# Patient Record
Sex: Male | Born: 1990 | Race: Black or African American | Hispanic: No | Marital: Single | Smoking: Current every day smoker
Health system: Southern US, Community
[De-identification: ages and names within clinical notes are randomized; demographics above are authoritative.]

## PROBLEM LIST (undated history)

## (undated) DIAGNOSIS — Z5900 Homelessness unspecified: Secondary | ICD-10-CM

## (undated) HISTORY — PX: SKIN GRAFT SPLIT THICKNESS LEG / FOOT: SUR1303

---
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
## 2011-08-20 ENCOUNTER — Encounter (HOSPITAL_COMMUNITY): Payer: Self-pay | Admitting: *Deleted

## 2011-08-20 ENCOUNTER — Emergency Department (HOSPITAL_COMMUNITY)
Admission: EM | Admit: 2011-08-20 | Discharge: 2011-08-20 | Disposition: A | Discharge status: Home / Self Care | Payer: 59 | Attending: Emergency Medicine | Admitting: Emergency Medicine

## 2011-08-20 DIAGNOSIS — R109 Unspecified abdominal pain: Secondary | ICD-10-CM | POA: Insufficient documentation

## 2011-08-20 DIAGNOSIS — R112 Nausea with vomiting, unspecified: Secondary | ICD-10-CM | POA: Insufficient documentation

## 2011-08-20 MED ORDER — ONDANSETRON 4 MG PO TBDP
4.0000 mg | ORAL_TABLET | Freq: Once | ORAL | Status: AC
  Administered 2011-08-20: 4 mg via ORAL
  Filled 2011-08-20: qty 1

## 2011-08-20 MED ORDER — PROMETHAZINE HCL 25 MG PO TABS: 25.0000 mg | ORAL_TABLET | Freq: Four times a day (QID) | ORAL | Status: AC | PRN

## 2011-08-20 NOTE — ED Provider Notes (Signed)
History     CSN: [id number]  Arrival date & time 08/20/11  1842   First MD Initiated Contact with Patient 08/20/11 2037      Chief Complaint  Patient presents with  . Nausea    (Consider location/radiation/quality/duration/timing/severity/associated sxs/prior treatment) Patient is a 21 y.o. male presenting with vomiting. The history is provided by the patient. No language interpreter was used.  Emesis  This is a new problem. The current episode started yesterday. The problem occurs 2 to 4 times per day. The problem has been gradually worsening. The emesis has an appearance of stomach contents. There has been no fever. Associated symptoms include abdominal pain. Pertinent negatives include no chills, no cough, no diarrhea, no fever and no headaches.    History reviewed. No pertinent past medical history.  History reviewed. No pertinent past surgical history.  No family history on file.  History  Substance Use Topics  . Smoking status: Not on file  . Smokeless tobacco: Not on file  . Alcohol Use: Not on file      Review of Systems  Constitutional: Negative for fever, chills, activity change, appetite change and fatigue.  HENT: Negative for congestion, sore throat, rhinorrhea, neck pain and neck stiffness.   Respiratory: Negative for cough and shortness of breath.   Cardiovascular: Negative for chest pain and palpitations.  Gastrointestinal: Positive for nausea, vomiting and abdominal pain. Negative for diarrhea.  Genitourinary: Negative for dysuria, urgency, frequency and flank pain.  Neurological: Negative for dizziness, weakness, light-headedness, numbness and headaches.  All other systems reviewed and are negative.    Allergies  Review of patient's allergies indicates no known allergies.  Home Medications   Current Outpatient Rx  Name Route Sig Dispense Refill  . PHENIRAMINE-PE-APAP 20-10-650 MG PO PACK Oral Take 30 mLs by mouth every 6 (six) hours as needed. For  sore throat symptoms    . PSEUDOEPH-DOXYLAMINE-DM-APAP 30-6.25-15-325 MG PO CAPS Oral Take 2 capsules by mouth every 6 (six) hours as needed. For fever reduction    . PROMETHAZINE HCL 25 MG PO TABS Oral Take 1 tablet (25 mg total) by mouth every 6 (six) hours as needed for nausea. 12 tablet 0    BP 120/80  Pulse 84  Temp(Src) 99.4 F (37.4 C) (Oral)  SpO2 99%  Physical Exam  Nursing note and vitals reviewed. Constitutional: He is oriented to person, place, and time. He appears well-developed and well-nourished. No distress.  HENT:  Head: Normocephalic and atraumatic.  Mouth/Throat: Oropharynx is clear and moist.  Eyes: Conjunctivae and EOM are normal. Pupils are equal, round, and reactive to light.  Neck: Normal range of motion. Neck supple.  Cardiovascular: Normal rate, regular rhythm, normal heart sounds and intact distal pulses.  Exam reveals no gallop and no friction rub.   No murmur heard. Abdominal: Soft. Bowel sounds are normal. There is no tenderness.  Musculoskeletal: Normal range of motion. He exhibits no tenderness.  Neurological: He is alert and oriented to person, place, and time. No cranial nerve deficit.  Skin: Skin is warm and dry. No rash noted.    ED Course  Procedures (including critical care time)  Labs Reviewed - No data to display No results found.   1. Nausea and vomiting       MDM  Patient with nausea and vomiting secondary to eating an entire large pizza. There is no indication for imaging or laboratory studies at this time. He will be given a dose of Zofran and instructed to followup  with primary care physician. I will discharge her with a small prescription of Phenergan.        Dayton Bailiff, MD 08/20/11 2208
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
## 2011-08-20 NOTE — ED Notes (Signed)
MD at bedside. King.

## 2015-12-08 ENCOUNTER — Emergency Department (HOSPITAL_COMMUNITY)
Admission: EM | Admit: 2015-12-08 | Discharge: 2015-12-08 | Disposition: A | Discharge status: Home / Self Care | Payer: Self-pay | Attending: Emergency Medicine | Admitting: Emergency Medicine

## 2015-12-08 ENCOUNTER — Emergency Department (HOSPITAL_COMMUNITY): Payer: Self-pay

## 2015-12-08 ENCOUNTER — Encounter (HOSPITAL_COMMUNITY): Payer: Self-pay | Admitting: Emergency Medicine

## 2015-12-08 DIAGNOSIS — R0602 Shortness of breath: Secondary | ICD-10-CM | POA: Insufficient documentation

## 2015-12-08 DIAGNOSIS — R079 Chest pain, unspecified: Secondary | ICD-10-CM | POA: Insufficient documentation

## 2015-12-08 DIAGNOSIS — R000 Tachycardia, unspecified: Secondary | ICD-10-CM | POA: Insufficient documentation

## 2015-12-08 LAB — CBC WITH DIFFERENTIAL/PLATELET
Basophils Absolute: 0 10*3/uL (ref 0.0–0.1)
Basophils Relative: 0 %
Eosinophils Absolute: 0 10*3/uL (ref 0.0–0.7)
Eosinophils Relative: 0 %
HCT: 41.2 % (ref 39.0–52.0)
Hemoglobin: 13.5 g/dL (ref 13.0–17.0)
Lymphocytes Relative: 19 %
Lymphs Abs: 1.4 10*3/uL (ref 0.7–4.0)
MCH: 26.2 pg (ref 26.0–34.0)
MCHC: 32.8 g/dL (ref 30.0–36.0)
MCV: 80 fL (ref 78.0–100.0)
Monocytes Absolute: 0.3 10*3/uL (ref 0.1–1.0)
Monocytes Relative: 4 %
Neutro Abs: 5.8 10*3/uL (ref 1.7–7.7)
Neutrophils Relative %: 77 %
Platelets: 211 10*3/uL (ref 150–400)
RBC: 5.15 MIL/uL (ref 4.22–5.81)
RDW: 14.2 % (ref 11.5–15.5)
WBC: 7.6 10*3/uL (ref 4.0–10.5)

## 2015-12-08 LAB — BRAIN NATRIURETIC PEPTIDE: B Natriuretic Peptide: 7 pg/mL (ref 0.0–100.0)

## 2015-12-08 LAB — BASIC METABOLIC PANEL
Anion gap: 10 (ref 5–15)
BUN: 9 mg/dL (ref 6–20)
CO2: 22 mmol/L (ref 22–32)
Calcium: 9.2 mg/dL (ref 8.9–10.3)
Chloride: 103 mmol/L (ref 101–111)
Creatinine, Ser: 0.93 mg/dL (ref 0.61–1.24)
GFR calc Af Amer: >60 mL/min (ref >60)
GFR calc non Af Amer: >60 mL/min (ref >60)
Glucose, Bld: 80 mg/dL (ref 65–99)
Potassium: 4.3 mmol/L (ref 3.5–5.1)
Sodium: 135 mmol/L (ref 135–145)

## 2015-12-08 LAB — D-DIMER, QUANTITATIVE: D-Dimer, Quant: <0.27 ug/mL-FEU (ref 0.00–0.50)

## 2015-12-08 LAB — TROPONIN I: Troponin I: <0.03 ng/mL (ref <0.031)

## 2015-12-08 MED ORDER — IBUPROFEN 800 MG PO TABS
800.0000 mg | ORAL_TABLET | Freq: Once | ORAL | Status: AC
  Administered 2015-12-08: 800 mg via ORAL
  Filled 2015-12-08: qty 1

## 2015-12-08 NOTE — ED Notes (Signed)
Per [facility] patient from home complaining of chest pain and shortness of breath x4 days.  Patient alert and oriented at this time.

## 2015-12-08 NOTE — ED Notes (Signed)
Pt verbalized understanding of discharge instructions and follow up care

## 2015-12-08 NOTE — Discharge Instructions (Signed)
Take over-the-counter ibuprofen every 4-6 hours as needed for your pain. Use moist heat 3-4 times daily also pain 20 minutes on, 20 minutes off. Do gentle stretches as tolerated. Please follow-up and establish care with a primary care provider, such as the one listed in your discharge paperwork or by calling the number circled on your discharge paperwork. Please return to emergency department if you develop any new or worsening symptoms, such as fever, cough.   Chest Wall Pain Chest wall pain is pain in or around the bones and muscles of your chest. Sometimes, an injury causes this pain. Sometimes, the cause may not be known. This pain may take several weeks or longer to get better. HOME CARE INSTRUCTIONS  Pay attention to any changes in your symptoms. Take these actions to help with your pain:   Rest as told by your health care provider.   Avoid activities that cause pain. These include any activities that use your chest muscles or your abdominal and side muscles to lift heavy items.   If directed, apply ice to the painful area:  Put ice in a plastic bag.  Place a towel between your skin and the bag.  Leave the ice on for 20 minutes, 2-3 times per day.  Take over-the-counter and prescription medicines only as told by your health care provider.  Do not use tobacco products, including cigarettes, chewing tobacco, and e-cigarettes. If you need help quitting, ask your health care provider.  Keep all follow-up visits as told by your health care provider. This is important. SEEK MEDICAL CARE IF:  You have a fever.  Your chest pain becomes worse.  You have new symptoms. SEEK IMMEDIATE MEDICAL CARE IF:  You have nausea or vomiting.  You feel sweaty or light-headed.  You have a cough with phlegm (sputum) or you cough up blood.  You develop shortness of breath.   This information is not intended to replace advice given to you by your health care provider. Make sure you discuss any  questions you have with your health care provider.   Document Released: 07/02/2005 Document Revised: 03/23/2015 Document Reviewed: 09/27/2014 Elsevier Interactive Patient Education [employer]2016 [employer] Inc.

## 2015-12-08 NOTE — ED Provider Notes (Signed)
CSN: [id number]     Arrival date & time 12/08/15  1710 History   First MD Initiated Contact with Patient 12/08/15 1804     Chief Complaint  Patient presents with  . Chest Pain     (Consider location/radiation/quality/duration/timing/severity/associated sxs/prior Treatment) HPI Comments: Patient is a previously healthy 25 year old male who presents with left-sided chest pain and shortness of breath with exertion for 1 week. Patient describes his pain as squeezing and intermittent every 10 minutes. The pain does not radiate. The pain is pleuritic as well as worse with movement. Patient rates his pain as a 6/10 at its worst. Patient has shortness of breath with exertion, not at rest. Patient has not tried any medications at home. Patient denies fall or injury. Patient denies cocaine use. Patient reports occasional marijuana use, patient does not smoke cigarettes. Patient denies fever, cough, abdominal pain, nausea, vomiting, dysuria.  Patient is a 25 y.o. male presenting with chest pain. The history is provided by the patient.  Chest Pain Associated symptoms: shortness of breath   Associated symptoms: no abdominal pain, no back pain, no fever, no headache, no nausea, no numbness and not vomiting     History reviewed. No pertinent past medical history. History reviewed. No pertinent past surgical history. No family history on file. Social History  Substance Use Topics  . Smoking status: Never Smoker   . Smokeless tobacco: None  . Alcohol Use: No    Review of Systems  Constitutional: Negative for fever and chills.  HENT: Negative for facial swelling and sore throat.   Respiratory: Positive for shortness of breath.   Cardiovascular: Positive for chest pain.  Gastrointestinal: Negative for nausea, vomiting and abdominal pain.  Genitourinary: Negative for dysuria.  Musculoskeletal: Negative for back pain.  Skin: Negative for rash and wound.  Neurological: Negative for numbness and  headaches.  Psychiatric/Behavioral: The patient is not nervous/anxious.       Allergies  Review of patient's allergies indicates no known allergies.  Home Medications   Prior to Admission medications   Not on File   BP 115/79 mmHg  Pulse 101  Temp(Src) 98.3 F (36.8 C) (Oral)  Resp 18  SpO2 100% Physical Exam  Constitutional: He appears well-developed and well-nourished. No distress.  HENT:  Head: Normocephalic and atraumatic.  Mouth/Throat: Oropharynx is clear and moist. No oropharyngeal exudate.  Eyes: Conjunctivae are normal. Pupils are equal, round, and reactive to light. Right eye exhibits no discharge. Left eye exhibits no discharge. No scleral icterus.  Neck: Normal range of motion. Neck supple. No thyromegaly present.  Cardiovascular: Regular rhythm, normal heart sounds and intact distal pulses.  Tachycardia present.  Exam reveals no gallop and no friction rub.   No murmur heard. Pulmonary/Chest: Effort normal and breath sounds normal. No stridor. No respiratory distress. He has no wheezes. He has no rales. He exhibits tenderness.    Abdominal: Soft. Bowel sounds are normal. He exhibits no distension. There is no tenderness. There is no rebound and no guarding.  Musculoskeletal: He exhibits no edema.  Equal bilateral grip strength, 5/5 strength in all 4 extremities; no tenderness to palpation to bilateral calves; no edema noted  Lymphadenopathy:    He has no cervical adenopathy.  Neurological: He is alert. Coordination normal.  Skin: Skin is warm and dry. No rash noted. He is not diaphoretic. No pallor.  Psychiatric: He has a normal mood and affect.  Nursing note and vitals reviewed.   ED Course  Procedures (including critical care time)  Labs Review Labs Reviewed  BASIC METABOLIC PANEL  CBC WITH DIFFERENTIAL/PLATELET  TROPONIN I  BRAIN NATRIURETIC PEPTIDE  D-DIMER, QUANTITATIVE (NOT [facility])    Imaging Review Dg Chest 2 View  12/08/2015  CLINICAL DATA:   Shortness of breath on exertion for 1 week. EXAM: CHEST  2 VIEW COMPARISON:  None. FINDINGS: Cardiomediastinal silhouette is normal. Mediastinal contours appear intact. There is no evidence of pleural effusion or pneumothorax. There are streaky peribronchial opacities in the right middle lobe. Osseous structures are without acute abnormality. Soft tissues are grossly normal. IMPRESSION: Streaky peribronchial opacities in the right middle lobe, which in the right clinical setting may represent an early bronchopneumonia. Electronically Signed   By: Ted Mcalpineobrinka  Dimitrova M.D.   On: 12/08/2015 19:26   I have personally reviewed and evaluated these images and lab results as part of my medical decision-making.   EKG Interpretation   Date/Time:  Thursday Dec 08 2015 17:22:23 EDT Ventricular Rate:  91 PR Interval:  153 QRS Duration: 79 QT Interval:  353 QTC Calculation: 434 R Axis:   88 Text Interpretation:  Sinus rhythm Right atrial enlargement No old tracing  to compare Confirmed by JACUBOWITZ  MD, SAM [phone]([id number]) on 12/08/2015 5:29:41  PM      MDM   EKG shows NSR, right atrial enlargement. Troponin <0.03. BNP 7.0. D-dimer <0.27. CBC, BMP unremarkable. CXR shows streaky peribronchial opacities in the right middle lobe, which is right clinical setting may represent early bronchopneumonia. However, patient has no fever, cough, symptoms to suggest pneumonia. Patient's pain completely resolved with ibuprofen and ED. Patient discharged with symptomatic treatment including ibuprofen and moist heat. Patient advised to follow up and establish care with [facility]. Patient vitals stable throughout ED course and discharged in satisfactory condition. Case discussed with Dr. Effie ShyWentz who is in agreement with plan.   Final diagnoses:  Nonspecific chest pain  Exertional shortness of breath        Emi Holeslexandra M Chaitanya Amedee, PA-C 12/08/15 2006  Mancel BaleElliott Wentz, MD 12/08/15 [id number]

## 2016-01-09 ENCOUNTER — Emergency Department (HOSPITAL_COMMUNITY)
Admission: EM | Admit: 2016-01-09 | Discharge: 2016-01-09 | Disposition: A | Discharge status: Home / Self Care | Payer: Self-pay | Attending: Emergency Medicine | Admitting: Emergency Medicine

## 2016-01-09 ENCOUNTER — Encounter (HOSPITAL_COMMUNITY): Payer: Self-pay | Admitting: *Deleted

## 2016-01-09 ENCOUNTER — Emergency Department (HOSPITAL_COMMUNITY): Payer: Self-pay

## 2016-01-09 DIAGNOSIS — F17200 Nicotine dependence, unspecified, uncomplicated: Secondary | ICD-10-CM | POA: Insufficient documentation

## 2016-01-09 DIAGNOSIS — Y939 Activity, unspecified: Secondary | ICD-10-CM | POA: Insufficient documentation

## 2016-01-09 DIAGNOSIS — R55 Syncope and collapse: Secondary | ICD-10-CM | POA: Insufficient documentation

## 2016-01-09 DIAGNOSIS — S00511A Abrasion of lip, initial encounter: Secondary | ICD-10-CM | POA: Insufficient documentation

## 2016-01-09 DIAGNOSIS — Y929 Unspecified place or not applicable: Secondary | ICD-10-CM | POA: Insufficient documentation

## 2016-01-09 DIAGNOSIS — S0083XA Contusion of other part of head, initial encounter: Secondary | ICD-10-CM | POA: Insufficient documentation

## 2016-01-09 DIAGNOSIS — F1210 Cannabis abuse, uncomplicated: Secondary | ICD-10-CM | POA: Insufficient documentation

## 2016-01-09 DIAGNOSIS — Y999 Unspecified external cause status: Secondary | ICD-10-CM | POA: Insufficient documentation

## 2016-01-09 MED ORDER — NAPROXEN 500 MG PO TABS
500.0000 mg | ORAL_TABLET | Freq: Once | ORAL | Status: AC
  Administered 2016-01-09: 500 mg via ORAL
  Filled 2016-01-09: qty 1

## 2016-01-09 MED ORDER — METHOCARBAMOL 500 MG PO TABS
1000.0000 mg | ORAL_TABLET | Freq: Once | ORAL | Status: AC
  Administered 2016-01-09: 1000 mg via ORAL
  Filled 2016-01-09: qty 2

## 2016-01-09 MED ORDER — METHOCARBAMOL 500 MG PO TABS: 500.0000 mg | ORAL_TABLET | Freq: Two times a day (BID) | ORAL | Status: AC

## 2016-01-09 MED ORDER — ACETAMINOPHEN 500 MG PO TABS
1000.0000 mg | ORAL_TABLET | Freq: Once | ORAL | Status: AC
  Administered 2016-01-09: 1000 mg via ORAL
  Filled 2016-01-09: qty 2

## 2016-01-09 MED ORDER — TETANUS-DIPHTH-ACELL PERTUSSIS 5-2.5-18.5 LF-MCG/0.5 IM SUSP
0.5000 mL | Freq: Once | INTRAMUSCULAR | Status: DC
  Filled 2016-01-09: qty 0.5

## 2016-01-09 MED ORDER — NAPROXEN 500 MG PO TABS: 500.0000 mg | ORAL_TABLET | Freq: Two times a day (BID) | ORAL | Status: AC

## 2016-01-09 MED ORDER — TETANUS-DIPHTH-ACELL PERTUSSIS 5-2.5-18.5 LF-MCG/0.5 IM SUSP
0.5000 mL | Freq: Once | INTRAMUSCULAR | Status: AC
  Administered 2016-01-09: 0.5 mL via INTRAMUSCULAR
  Filled 2016-01-09: qty 0.5
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
## 2017-11-07 ENCOUNTER — Emergency Department (HOSPITAL_COMMUNITY)
Admission: EM | Admit: 2017-11-07 | Discharge: 2017-11-08 | Disposition: A | Discharge status: Home / Self Care | Payer: Self-pay | Attending: Emergency Medicine | Admitting: Emergency Medicine

## 2017-11-07 ENCOUNTER — Other Ambulatory Visit: Payer: Self-pay

## 2017-11-07 ENCOUNTER — Encounter (HOSPITAL_COMMUNITY): Payer: Self-pay

## 2017-11-07 DIAGNOSIS — R0602 Shortness of breath: Secondary | ICD-10-CM | POA: Insufficient documentation

## 2017-11-07 DIAGNOSIS — R079 Chest pain, unspecified: Secondary | ICD-10-CM | POA: Insufficient documentation

## 2017-11-07 DIAGNOSIS — F419 Anxiety disorder, unspecified: Secondary | ICD-10-CM | POA: Insufficient documentation

## 2017-11-07 DIAGNOSIS — F17200 Nicotine dependence, unspecified, uncomplicated: Secondary | ICD-10-CM | POA: Insufficient documentation

## 2017-11-07 DIAGNOSIS — Z590 Homelessness: Secondary | ICD-10-CM | POA: Insufficient documentation

## 2017-11-07 HISTORY — DX: Homelessness unspecified: Z59.00

## 2017-11-07 HISTORY — DX: Homelessness: Z59.0

## 2017-11-07 LAB — CBC
HCT: 43.5 % (ref 39.0–52.0)
Hemoglobin: 14.8 g/dL (ref 13.0–17.0)
MCH: 27.7 pg (ref 26.0–34.0)
MCHC: 34 g/dL (ref 30.0–36.0)
MCV: 81.3 fL (ref 78.0–100.0)
PLATELETS: 245 10*3/uL (ref 150–400)
RBC: 5.35 MIL/uL (ref 4.22–5.81)
RDW: 14.5 % (ref 11.5–15.5)
WBC: 7.1 10*3/uL (ref 4.0–10.5)

## 2017-11-07 LAB — D-DIMER, QUANTITATIVE (NOT AT ARMC)

## 2017-11-07 LAB — BASIC METABOLIC PANEL
Anion gap: 10 (ref 5–15)
BUN: 8 mg/dL (ref 6–20)
CO2: 26 mmol/L (ref 22–32)
CREATININE: 0.88 mg/dL (ref 0.61–1.24)
Calcium: 10.1 mg/dL (ref 8.9–10.3)
Chloride: 101 mmol/L (ref 101–111)
Glucose, Bld: 95 mg/dL (ref 65–99)
POTASSIUM: 3.6 mmol/L (ref 3.5–5.1)
SODIUM: 137 mmol/L (ref 135–145)

## 2017-11-07 LAB — I-STAT TROPONIN, ED: Troponin i, poc: 0 ng/mL (ref 0.00–0.08)

## 2017-11-07 MED ORDER — LACTATED RINGERS IV BOLUS
1000.0000 mL | Freq: Once | INTRAVENOUS | Status: AC
  Administered 2017-11-07: 1000 mL via INTRAVENOUS

## 2017-11-07 MED ORDER — LORAZEPAM 1 MG PO TABS
1.0000 mg | ORAL_TABLET | Freq: Once | ORAL | Status: AC
  Administered 2017-11-07: 1 mg via ORAL
  Filled 2017-11-07: qty 1
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
## 2017-11-07 NOTE — ED Notes (Signed)
EKG read STEMI, Dr Clayborne DanaMesner given EKG and in triage assessing patient.

## 2017-12-20 ENCOUNTER — Emergency Department (HOSPITAL_COMMUNITY)
Admission: EM | Admit: 2017-12-20 | Discharge: 2017-12-20 | Disposition: A | Discharge status: Home / Self Care | Payer: Self-pay | Attending: Emergency Medicine | Admitting: Emergency Medicine

## 2017-12-20 ENCOUNTER — Encounter (HOSPITAL_COMMUNITY): Payer: Self-pay | Admitting: Emergency Medicine

## 2017-12-20 ENCOUNTER — Emergency Department (HOSPITAL_COMMUNITY): Payer: Self-pay

## 2017-12-20 ENCOUNTER — Other Ambulatory Visit: Payer: Self-pay

## 2017-12-20 DIAGNOSIS — Z590 Homelessness: Secondary | ICD-10-CM | POA: Insufficient documentation

## 2017-12-20 DIAGNOSIS — F17210 Nicotine dependence, cigarettes, uncomplicated: Secondary | ICD-10-CM | POA: Insufficient documentation

## 2017-12-20 DIAGNOSIS — R079 Chest pain, unspecified: Secondary | ICD-10-CM | POA: Insufficient documentation

## 2017-12-20 LAB — I-STAT TROPONIN, ED: Troponin i, poc: 0 ng/mL (ref 0.00–0.08)

## 2017-12-20 LAB — BASIC METABOLIC PANEL
Anion gap: 8 (ref 5–15)
BUN: 10 mg/dL (ref 6–20)
CO2: 27 mmol/L (ref 22–32)
Calcium: 9.8 mg/dL (ref 8.9–10.3)
Chloride: 104 mmol/L (ref 101–111)
Creatinine, Ser: 0.85 mg/dL (ref 0.61–1.24)
GFR calc Af Amer: >60 mL/min (ref >60)
GFR calc non Af Amer: >60 mL/min (ref >60)
Glucose, Bld: 86 mg/dL (ref 65–99)
Potassium: 4.7 mmol/L (ref 3.5–5.1)
Sodium: 139 mmol/L (ref 135–145)

## 2017-12-20 LAB — CBC
HCT: 46 % (ref 39.0–52.0)
Hemoglobin: 15.3 g/dL (ref 13.0–17.0)
MCH: 28.3 pg (ref 26.0–34.0)
MCHC: 33.3 g/dL (ref 30.0–36.0)
MCV: 85 fL (ref 78.0–100.0)
Platelets: 292 10*3/uL (ref 150–400)
RBC: 5.41 MIL/uL (ref 4.22–5.81)
RDW: 15.3 % (ref 11.5–15.5)
WBC: 11.1 10*3/uL — ABNORMAL HIGH (ref 4.0–10.5)

## 2017-12-20 NOTE — ED Triage Notes (Addendum)
Patient states he is hungry and has not ate in days. Patient also states he is having mid chest pain.

## 2017-12-20 NOTE — ED Provider Notes (Signed)
[facility]-EMERGENCY DEPT Provider Note   CSN: [id number] Arrival date & time: 12/20/17  [id number]     History   Chief Complaint Chief Complaint  Patient presents with  . Patient is hungry  . Chest Pain    HPI Todd Ellison is a 27 y.o. male.  HPI  27 year old male with chest pain.  He describes a sharp pain in the center of his chest.  It is waxed and waned over the past several days.  Says it is worse with coughing and yelling.  He does not feel short of breath though.  No fevers or chills.  No unusual leg pain or swelling.  He has not noticed any change in character supine versus sitting up.  Past Medical History:  Diagnosis Date  . Homeless     There are no active problems to display for this patient.   Past Surgical History:  Procedure Laterality Date  . SKIN GRAFT SPLIT THICKNESS LEG / FOOT Right         Home Medications    Prior to Admission medications   Medication Sig Start Date End Date Taking? Authorizing Provider  methocarbamol (ROBAXIN) 500 MG tablet Take 1 tablet (500 mg total) by mouth 2 (two) times daily. 01/09/16   Palumbo, April, MD  naproxen (NAPROSYN) 500 MG tablet Take 1 tablet (500 mg total) by mouth 2 (two) times daily. 01/09/16   Palumbo, April, MD    Family History No family history on file.  Social History Social History   Tobacco Use  . Smoking status: Current Every Day Smoker    Packs/day: 2.00  . Smokeless tobacco: Never Used  Substance Use Topics  . Alcohol use: Yes    Alcohol/week: 0.6 oz    Types: 1 Glasses of wine per week  . Drug use: Yes    Types: Marijuana    Comment: occasional     Allergies   Patient has no known allergies.   Review of Systems Review of Systems  All systems reviewed and negative, other than as noted in HPI.  Physical Exam Updated Vital Signs BP 110/75 (BP Location: Left Arm)   Pulse (!) 58   Temp 97.7 F (36.5 C) (Oral)   Resp 14   Ht 6\' 1"  (1.854 m)   Wt 59 kg  (130 lb)   SpO2 100%   BMI 17.15 kg/m   Physical Exam  Constitutional: He appears well-developed and well-nourished. No distress.  HENT:  Head: Normocephalic and atraumatic.  Eyes: Conjunctivae are normal. Right eye exhibits no discharge. Left eye exhibits no discharge.  Neck: Neck supple.  Cardiovascular: Normal rate, regular rhythm and normal heart sounds. Exam reveals no gallop and no friction rub.  No murmur heard. Pulmonary/Chest: Effort normal and breath sounds normal. No respiratory distress.  Abdominal: Soft. He exhibits no distension. There is no tenderness.  Musculoskeletal: He exhibits no edema or tenderness.  Lower extremities symmetric as compared to each other. No calf tenderness. Negative Homan's. No palpable cords.   Neurological: He is alert.  Skin: Skin is warm and dry.  Psychiatric: He has a normal mood and affect. His behavior is normal. Thought content normal.  Nursing note and vitals reviewed.    ED Treatments / Results  Labs (all labs ordered are listed, but only abnormal results are displayed) Labs Reviewed  CBC - Abnormal; Notable for the following components:      Result Value   WBC 11.1 (*)    All other components  within normal limits  BASIC METABOLIC PANEL  I-STAT TROPONIN, ED    EKG EKG Interpretation  Date/Time:  Friday December 20 2017 07:07:27 EDT Ventricular Rate:  68 PR Interval:    QRS Duration: 91 QT Interval:  390 QTC Calculation: 415 R Axis:   86 Text Interpretation:  Sinus arrhythmia Right atrial enlargement Borderline repolarization abnormality ST elevation, consider inferior injury Confirmed by Raeford Razor [phone]) on 12/20/2017 8:11:26 AM   Radiology Dg Chest 2 View  Result Date: 12/20/2017 CLINICAL DATA:  Chest pain. EXAM: CHEST - 2 VIEW COMPARISON:  Radiographs of Dec 08, 2015. FINDINGS: The heart size and mediastinal contours are within normal limits. Both lungs are clear. No pneumothorax or pleural effusion is noted. The  visualized skeletal structures are unremarkable. IMPRESSION: No active cardiopulmonary disease. Electronically Signed   By: Lupita Raider, M.D.   On: 12/20/2017 07:57    Procedures Procedures (including critical care time)  Medications Ordered in ED Medications - No data to display   Initial Impression / Assessment and Plan / ED Course  I have reviewed the triage vital signs and the nursing notes.  Pertinent labs & imaging results that were available during my care of the patient were reviewed by me and considered in my medical decision making (see chart for details).     27yM with CP. I doubt ACS, PE, dissection or other emergent process. EKG doesn't looked significantly changed form prior. CXR clear. Labs fine aside from minimal leukocytosis. May be some element of secondary gain. He is homeless and is asking for food. Resource list provided.   Final Clinical Impressions(s) / ED Diagnoses   Final diagnoses:  Chest pain, unspecified type    ED Discharge Orders    None       Raeford Razor, MD 12/20/17 [phone]

## 2017-12-20 NOTE — ED Notes (Signed)
Pt transported to xray 

## 2017-12-29 ENCOUNTER — Encounter (HOSPITAL_COMMUNITY): Payer: Self-pay | Admitting: Emergency Medicine

## 2017-12-29 ENCOUNTER — Emergency Department (HOSPITAL_COMMUNITY)
Admission: EM | Admit: 2017-12-29 | Discharge: 2017-12-29 | Disposition: A | Discharge status: Home / Self Care | Payer: Self-pay | Attending: Emergency Medicine | Admitting: Emergency Medicine

## 2017-12-29 ENCOUNTER — Emergency Department (HOSPITAL_COMMUNITY): Payer: Self-pay

## 2017-12-29 DIAGNOSIS — R0789 Other chest pain: Secondary | ICD-10-CM | POA: Insufficient documentation

## 2017-12-29 DIAGNOSIS — F17200 Nicotine dependence, unspecified, uncomplicated: Secondary | ICD-10-CM | POA: Insufficient documentation

## 2017-12-29 LAB — CBC
HCT: 45.8 % (ref 39.0–52.0)
Hemoglobin: 15.3 g/dL (ref 13.0–17.0)
MCH: 27.8 pg (ref 26.0–34.0)
MCHC: 33.4 g/dL (ref 30.0–36.0)
MCV: 83.1 fL (ref 78.0–100.0)
PLATELETS: 206 10*3/uL (ref 150–400)
RBC: 5.51 MIL/uL (ref 4.22–5.81)
RDW: 14.8 % (ref 11.5–15.5)
WBC: 7.7 10*3/uL (ref 4.0–10.5)

## 2017-12-29 LAB — I-STAT TROPONIN, ED: Troponin i, poc: 0 ng/mL (ref 0.00–0.08)

## 2017-12-29 LAB — BASIC METABOLIC PANEL
Anion gap: 11 (ref 5–15)
BUN: 8 mg/dL (ref 6–20)
CO2: 23 mmol/L (ref 22–32)
CREATININE: 0.86 mg/dL (ref 0.61–1.24)
Calcium: 10.3 mg/dL (ref 8.9–10.3)
Chloride: 105 mmol/L (ref 101–111)
GFR calc Af Amer: >60 mL/min (ref >60)
GFR calc non Af Amer: >60 mL/min (ref >60)
Glucose, Bld: 78 mg/dL (ref 65–99)
Potassium: 4 mmol/L (ref 3.5–5.1)
SODIUM: 139 mmol/L (ref 135–145)

## 2017-12-29 MED ORDER — GI COCKTAIL ~~LOC~~
30.0000 mL | Freq: Once | ORAL | Status: AC
  Administered 2017-12-29: 30 mL via ORAL
  Filled 2017-12-29: qty 30

## 2017-12-29 MED ORDER — LORAZEPAM 1 MG PO TABS
1.0000 mg | ORAL_TABLET | Freq: Once | ORAL | Status: AC
  Administered 2017-12-29: 1 mg via ORAL
  Filled 2017-12-29: qty 1
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
## 2017-12-29 NOTE — Discharge Instructions (Signed)
Try pepcid or zantac.  Take this twice a day for the next week.  Follow up with your PCP.

## 2017-12-29 NOTE — ED Triage Notes (Signed)
Pt reports that he has had chest tightness x 2 days. He was able to sleep, but pain was worse when he woke up. Denies NVD Pt stated that he has been working outside and may be hungry and thirsty

## 2019-06-16 IMAGING — CR DG CHEST 2V
2 series · 2 of 2 positions shown · non-contrast
Comparison: Radiographs December 08, 2015.

CLINICAL DATA: Chest pain.

EXAM:
CHEST - 2 VIEW

[w chest pa]
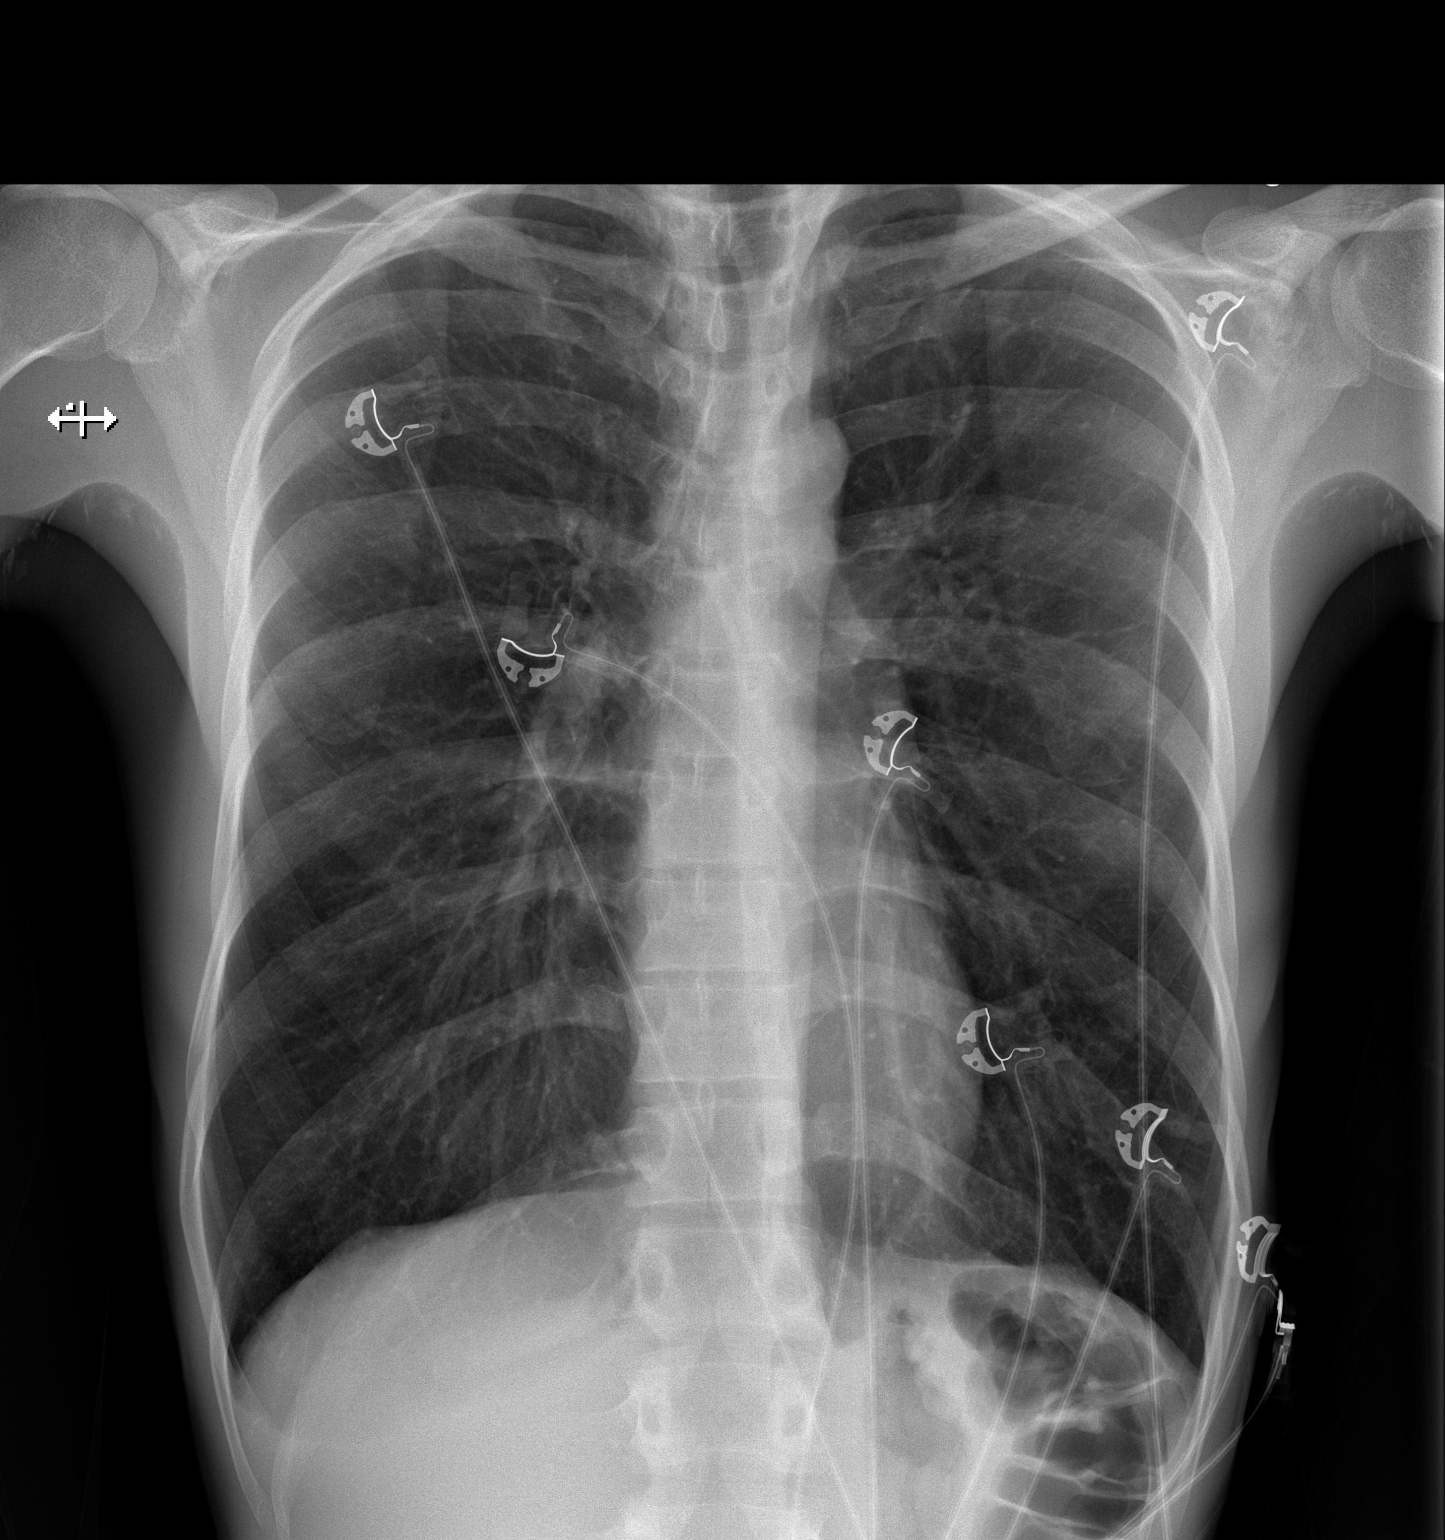

[w chest lat]
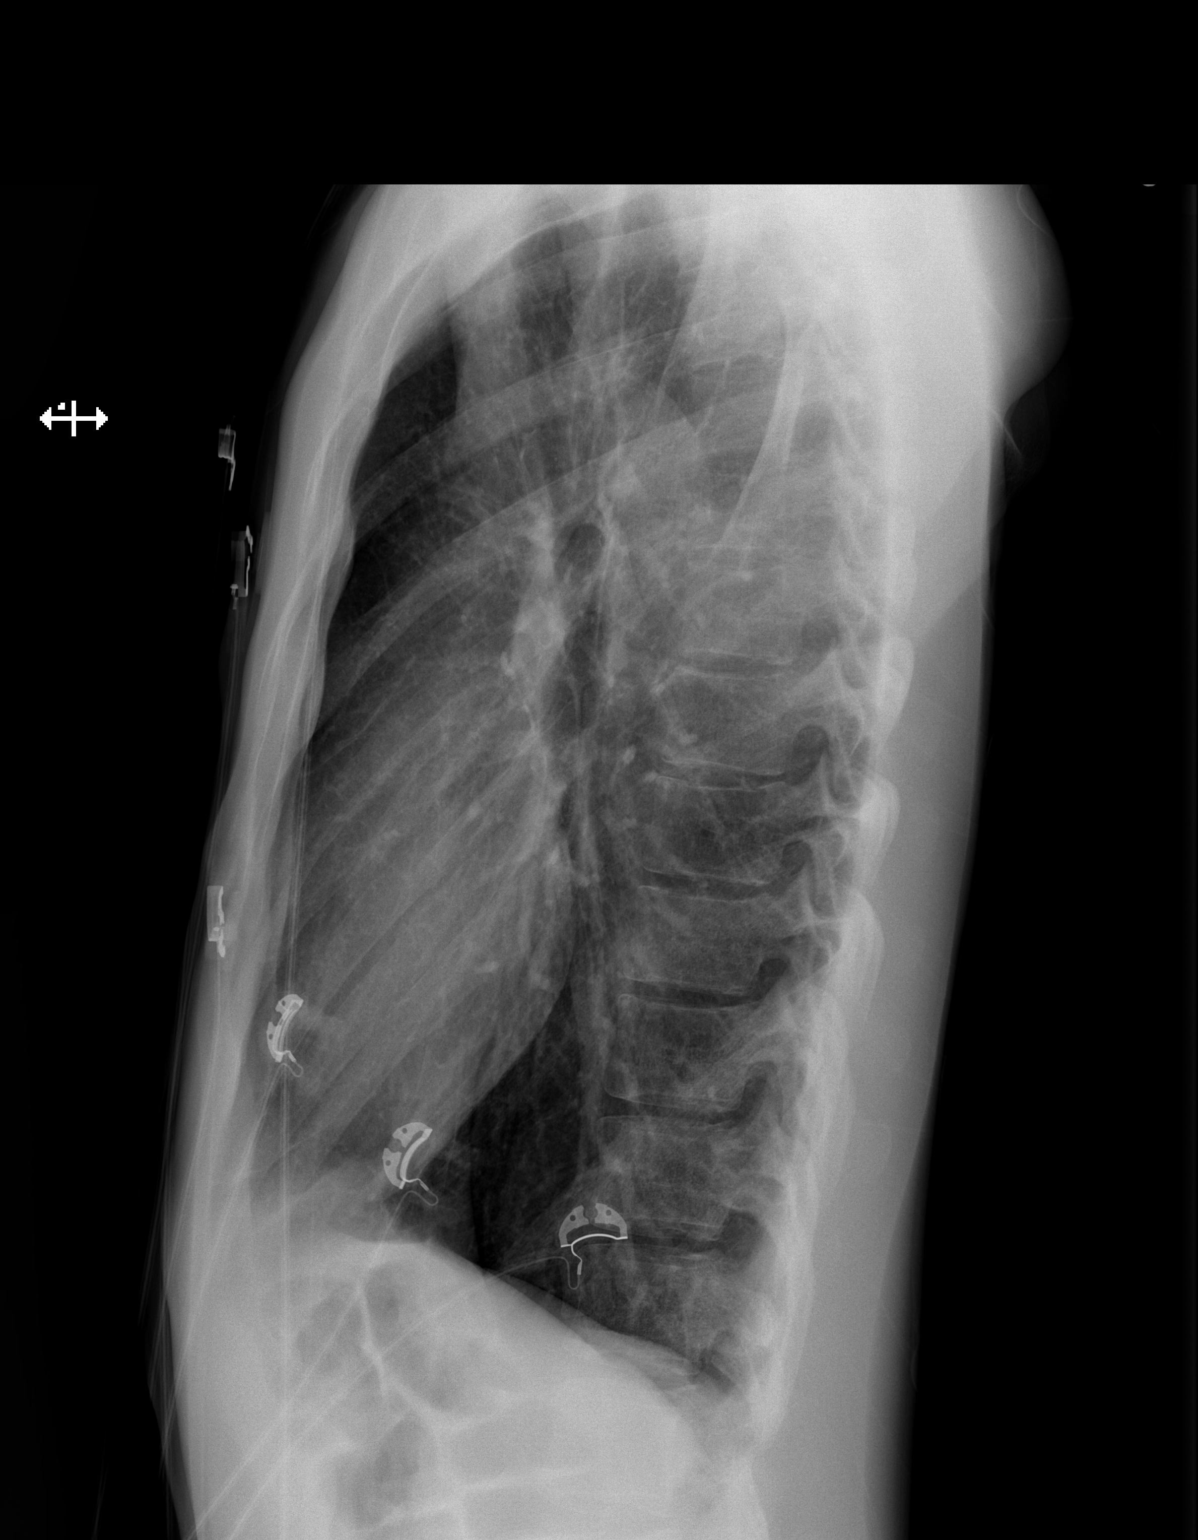

[2 of 2 positions shown; findings below may reference images not displayed]

FINDINGS: The heart size and mediastinal contours are within normal limits.
Both lungs are clear. No pneumothorax or pleural effusion is noted.
The visualized skeletal structures are unremarkable.
IMPRESSION: No active cardiopulmonary disease.
